# Patient Record
Sex: Female | Born: 1970 | Race: White | Hispanic: No | Smoking: Never smoker
Health system: Southern US, Community
[De-identification: ages and names within clinical notes are randomized; demographics above are authoritative.]

## PROBLEM LIST (undated history)

## (undated) DIAGNOSIS — F32A Depression, unspecified: Secondary | ICD-10-CM

## (undated) DIAGNOSIS — F329 Major depressive disorder, single episode, unspecified: Secondary | ICD-10-CM

## (undated) DIAGNOSIS — E079 Disorder of thyroid, unspecified: Secondary | ICD-10-CM

---

## 1998-09-01 ENCOUNTER — Other Ambulatory Visit: Admission: RE | Admit: 1998-09-01 | Discharge: 1998-09-01 | Payer: Self-pay | Admitting: Family Medicine

## 1999-10-13 ENCOUNTER — Other Ambulatory Visit: Admission: RE | Admit: 1999-10-13 | Discharge: 1999-10-13 | Payer: Self-pay | Admitting: Family Medicine

## 2000-07-11 ENCOUNTER — Encounter: Payer: Self-pay | Admitting: Family Medicine

## 2000-07-11 ENCOUNTER — Encounter: Admission: RE | Admit: 2000-07-11 | Discharge: 2000-07-11 | Payer: Self-pay | Admitting: Family Medicine

## 2002-08-09 ENCOUNTER — Encounter: Admission: RE | Admit: 2002-08-09 | Discharge: 2002-08-09 | Payer: Self-pay | Admitting: Family Medicine

## 2002-08-09 ENCOUNTER — Encounter: Payer: Self-pay | Admitting: Family Medicine

## 2002-10-26 ENCOUNTER — Other Ambulatory Visit: Admission: RE | Admit: 2002-10-26 | Discharge: 2002-10-26 | Payer: Self-pay | Admitting: Family Medicine

## 2002-12-21 ENCOUNTER — Other Ambulatory Visit: Admission: RE | Admit: 2002-12-21 | Discharge: 2002-12-21 | Payer: Self-pay | Admitting: Family Medicine

## 2003-07-18 ENCOUNTER — Encounter (HOSPITAL_COMMUNITY): Admission: RE | Admit: 2003-07-18 | Discharge: 2003-10-16 | Payer: Self-pay | Admitting: Family Medicine

## 2003-12-20 ENCOUNTER — Other Ambulatory Visit: Admission: RE | Admit: 2003-12-20 | Discharge: 2003-12-20 | Payer: Self-pay | Admitting: Family Medicine

## 2004-01-14 ENCOUNTER — Ambulatory Visit: Payer: Self-pay | Admitting: Family Medicine

## 2004-02-04 ENCOUNTER — Ambulatory Visit: Payer: Self-pay | Admitting: Family Medicine

## 2004-02-14 ENCOUNTER — Ambulatory Visit: Payer: Self-pay | Admitting: Family Medicine

## 2004-03-10 ENCOUNTER — Ambulatory Visit: Payer: Self-pay | Admitting: Family Medicine

## 2004-03-17 ENCOUNTER — Ambulatory Visit: Payer: Self-pay | Admitting: Family Medicine

## 2004-04-19 ENCOUNTER — Emergency Department (HOSPITAL_COMMUNITY): Admission: EM | Admit: 2004-04-19 | Discharge: 2004-04-19 | Payer: Self-pay | Admitting: Emergency Medicine

## 2004-04-20 ENCOUNTER — Ambulatory Visit: Payer: Self-pay | Admitting: Family Medicine

## 2004-05-08 ENCOUNTER — Ambulatory Visit: Payer: Self-pay | Admitting: Family Medicine

## 2004-05-13 ENCOUNTER — Encounter (HOSPITAL_COMMUNITY): Admission: RE | Admit: 2004-05-13 | Discharge: 2004-08-11 | Payer: Self-pay | Admitting: Endocrinology

## 2004-06-02 ENCOUNTER — Ambulatory Visit (HOSPITAL_COMMUNITY): Admission: RE | Admit: 2004-06-02 | Discharge: 2004-06-02 | Payer: Self-pay | Admitting: *Deleted

## 2004-06-08 ENCOUNTER — Ambulatory Visit: Payer: Self-pay | Admitting: Family Medicine

## 2004-06-30 ENCOUNTER — Ambulatory Visit: Payer: Self-pay | Admitting: Family Medicine

## 2004-07-06 ENCOUNTER — Encounter: Admission: RE | Admit: 2004-07-06 | Discharge: 2004-07-06 | Payer: Self-pay | Admitting: Family Medicine

## 2004-08-04 ENCOUNTER — Ambulatory Visit: Payer: Self-pay | Admitting: Family Medicine

## 2004-08-25 ENCOUNTER — Ambulatory Visit: Payer: Self-pay | Admitting: Family Medicine

## 2004-09-16 ENCOUNTER — Ambulatory Visit: Payer: Self-pay | Admitting: Family Medicine

## 2004-10-16 ENCOUNTER — Ambulatory Visit: Payer: Self-pay | Admitting: Family Medicine

## 2004-10-30 ENCOUNTER — Ambulatory Visit: Payer: Self-pay | Admitting: Family Medicine

## 2004-12-15 ENCOUNTER — Ambulatory Visit: Payer: Self-pay | Admitting: Family Medicine

## 2004-12-22 ENCOUNTER — Ambulatory Visit: Payer: Self-pay | Admitting: Family Medicine

## 2004-12-22 ENCOUNTER — Other Ambulatory Visit: Admission: RE | Admit: 2004-12-22 | Discharge: 2004-12-22 | Payer: Self-pay | Admitting: Family Medicine

## 2004-12-22 ENCOUNTER — Encounter (INDEPENDENT_AMBULATORY_CARE_PROVIDER_SITE_OTHER): Payer: Self-pay | Admitting: *Deleted

## 2005-01-06 ENCOUNTER — Ambulatory Visit: Payer: Self-pay | Admitting: Family Medicine

## 2005-03-17 ENCOUNTER — Encounter (INDEPENDENT_AMBULATORY_CARE_PROVIDER_SITE_OTHER): Payer: Self-pay | Admitting: *Deleted

## 2005-03-17 ENCOUNTER — Other Ambulatory Visit: Admission: RE | Admit: 2005-03-17 | Discharge: 2005-03-17 | Payer: Self-pay | Admitting: Family Medicine

## 2005-03-17 ENCOUNTER — Ambulatory Visit: Payer: Self-pay | Admitting: Family Medicine

## 2005-07-23 ENCOUNTER — Ambulatory Visit: Payer: Self-pay | Admitting: Family Medicine

## 2005-11-19 ENCOUNTER — Ambulatory Visit: Payer: Self-pay | Admitting: Family Medicine

## 2006-02-22 ENCOUNTER — Ambulatory Visit: Payer: Self-pay | Admitting: Family Medicine

## 2006-02-23 LAB — CONVERTED CEMR LAB: TSH: 3.8 microintl units/mL (ref 0.35–5.50)

## 2006-05-20 ENCOUNTER — Ambulatory Visit: Payer: Self-pay | Admitting: Family Medicine

## 2006-05-20 LAB — CONVERTED CEMR LAB
BUN: 9 mg/dL (ref 6–23)
Basophils Relative: 0.3 % (ref 0.0–1.0)
CO2: 29 meq/L (ref 19–32)
Chloride: 99 meq/L (ref 96–112)
Creatinine, Ser: 0.9 mg/dL (ref 0.4–1.2)
Eosinophils Absolute: 0.1 10*3/uL (ref 0.0–0.6)
Eosinophils Relative: 1 % (ref 0.0–5.0)
GFR calc Af Amer: 91 mL/min
HCT: 40.3 % (ref 36.0–46.0)
Lymphocytes Relative: 36.8 % (ref 12.0–46.0)
Monocytes Absolute: 0.8 10*3/uL — ABNORMAL HIGH (ref 0.2–0.7)
Neutro Abs: 3.5 10*3/uL (ref 1.4–7.7)
RDW: 12.4 % (ref 11.5–14.6)
Sodium: 137 meq/L (ref 135–145)
WBC: 6.9 10*3/uL (ref 4.5–10.5)

## 2006-05-31 ENCOUNTER — Ambulatory Visit: Payer: Self-pay | Admitting: Endocrinology

## 2006-06-07 ENCOUNTER — Encounter: Payer: Self-pay | Admitting: Endocrinology

## 2006-06-07 LAB — CONVERTED CEMR LAB
Catecholamines Tot(E+NE) 24 Hr U: 0.092 mg/24hr
Dopamine 24 Hr Urine: 102 mcg/24hr (ref <500)
Metanephrines, Ur: 59 (ref 19–140)

## 2006-07-01 ENCOUNTER — Ambulatory Visit: Payer: Self-pay | Admitting: Family Medicine

## 2006-07-01 ENCOUNTER — Encounter: Admission: RE | Admit: 2006-07-01 | Discharge: 2006-07-01 | Payer: Self-pay | Admitting: Family Medicine

## 2006-07-01 ENCOUNTER — Inpatient Hospital Stay (HOSPITAL_COMMUNITY): Admission: EM | Admit: 2006-07-01 | Discharge: 2006-07-03 | Payer: Self-pay | Admitting: Emergency Medicine

## 2006-07-02 ENCOUNTER — Ambulatory Visit: Payer: Self-pay | Admitting: Internal Medicine

## 2006-07-04 ENCOUNTER — Ambulatory Visit: Payer: Self-pay | Admitting: Family Medicine

## 2006-07-07 ENCOUNTER — Ambulatory Visit: Payer: Self-pay | Admitting: Family Medicine

## 2006-07-14 ENCOUNTER — Ambulatory Visit: Payer: Self-pay | Admitting: Family Medicine

## 2006-07-26 ENCOUNTER — Ambulatory Visit: Payer: Self-pay | Admitting: Family Medicine

## 2006-07-26 LAB — CONVERTED CEMR LAB: TSH: 2.27 microintl units/mL (ref 0.35–5.50)

## 2006-08-02 ENCOUNTER — Ambulatory Visit: Payer: Self-pay | Admitting: Family Medicine

## 2006-08-09 ENCOUNTER — Ambulatory Visit: Payer: Self-pay | Admitting: Family Medicine

## 2006-09-07 ENCOUNTER — Ambulatory Visit: Payer: Self-pay | Admitting: Family Medicine

## 2006-09-13 ENCOUNTER — Ambulatory Visit: Payer: Self-pay | Admitting: Family Medicine

## 2006-09-27 DIAGNOSIS — J45909 Unspecified asthma, uncomplicated: Secondary | ICD-10-CM | POA: Insufficient documentation

## 2006-09-27 DIAGNOSIS — J309 Allergic rhinitis, unspecified: Secondary | ICD-10-CM | POA: Insufficient documentation

## 2006-11-24 ENCOUNTER — Telehealth: Payer: Self-pay | Admitting: Family Medicine

## 2006-11-25 ENCOUNTER — Encounter: Payer: Self-pay | Admitting: Endocrinology

## 2006-11-25 DIAGNOSIS — E039 Hypothyroidism, unspecified: Secondary | ICD-10-CM | POA: Insufficient documentation

## 2006-11-25 DIAGNOSIS — F329 Major depressive disorder, single episode, unspecified: Secondary | ICD-10-CM

## 2007-06-07 IMAGING — CT CT ANGIO CHEST
3 of 4 series · 19 of 30 positions shown · IV contrast ([ID] OMNI 300)
Comparison: none

CLINICAL DATA: Chest pain and shortness of breath 1 week postop pelvic surgery.  
 CT ANGIOGRAPHY OF CHEST:
TECHNIQUE: Multidetector CT imaging of the chest was performed during bolus injection of intravenous contrast.  Multiplanar CT angiographic image reconstructions were generated to evaluate the vascular anatomy.
 Contrast:  125 cc Omnipaque 300

[Series 4: pe study · axial · 0.70mm/px · z∈[-251,-53]mm · 6 of 119 slices shown]
[im 20/119  lung]
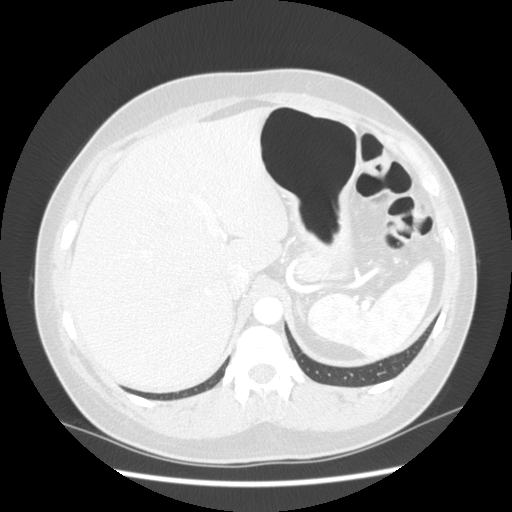
[im 40/119  mediastinal]
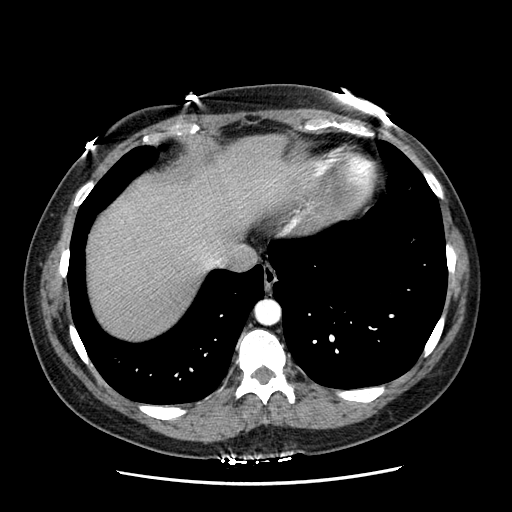
[im 60/119  lung]
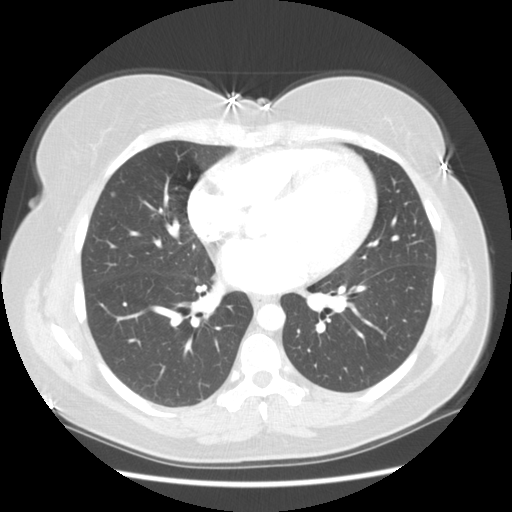
[im 63/119  mediastinal]
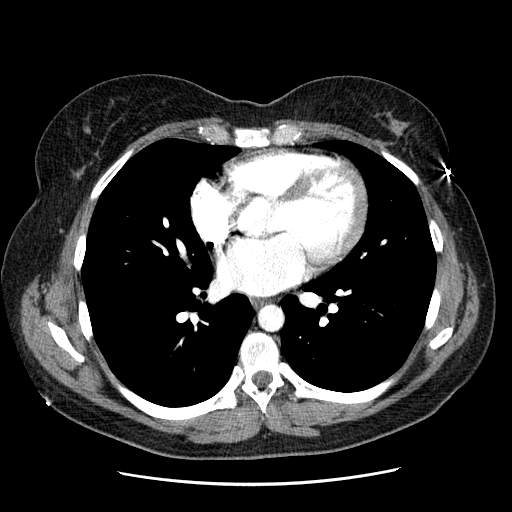
[im 79/119  lung]
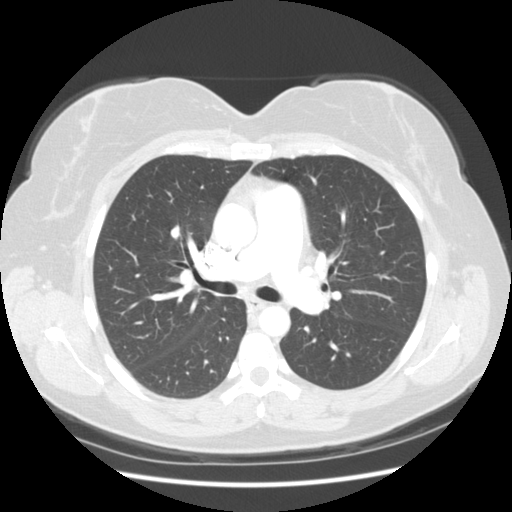
[im 99/119  mediastinal]
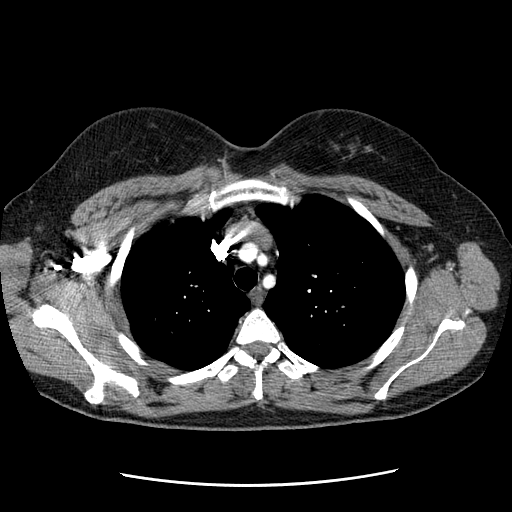

[Series 600: coronal · coronal · 0.70mm/px · 5 of 124 slices shown]
[im 18/124  lung]
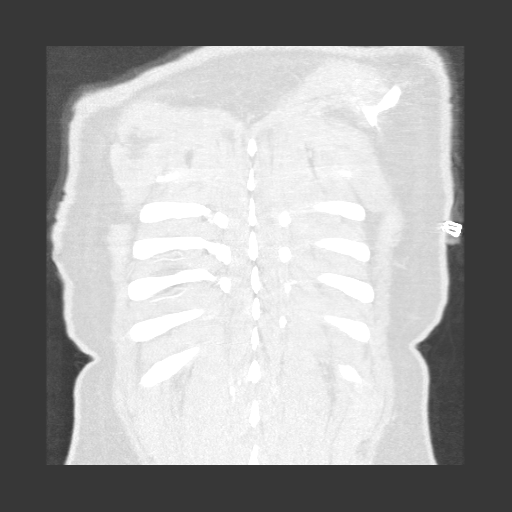
[im 36/124  lung]
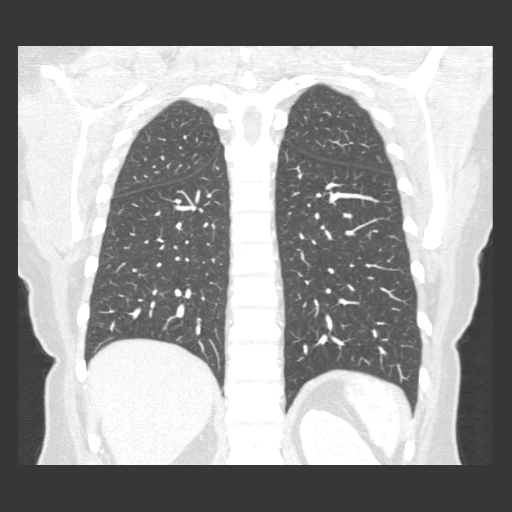
[im 53/124  lung]
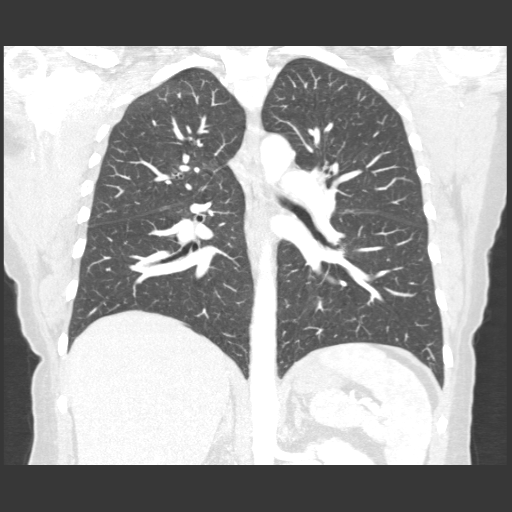
[im 71/124  lung]
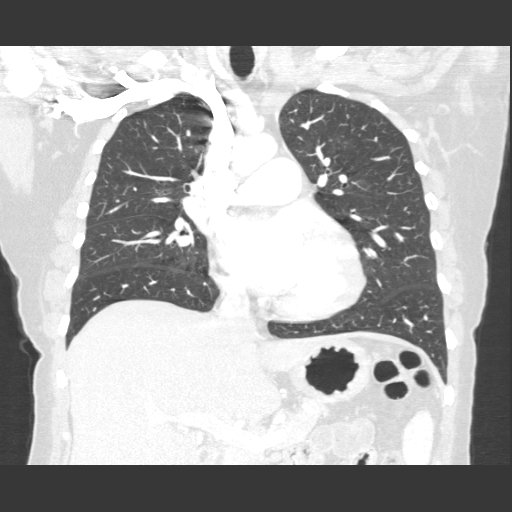
[im 88/124  lung]
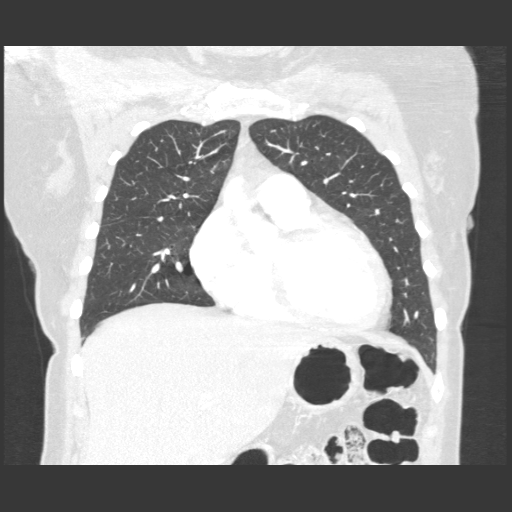

[Series 601: sagittal · sagittal · 0.70mm/px · 8 of 190 slices shown]
[im 18/190  lung]
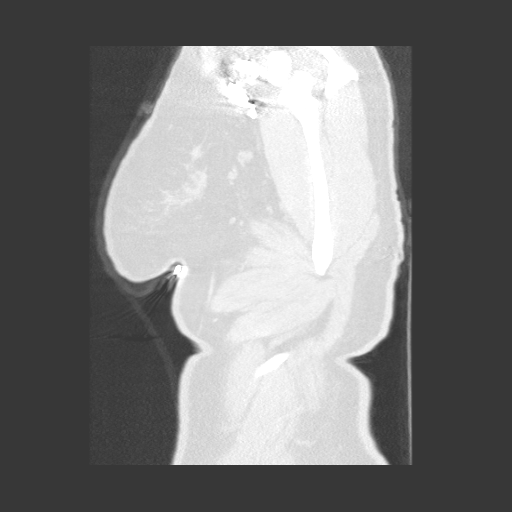
[im 52/190  lung]
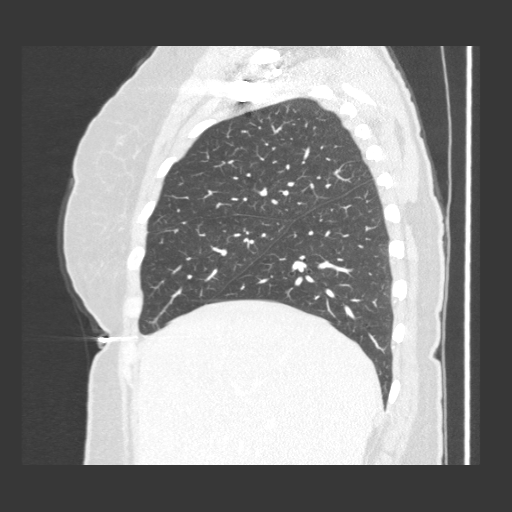
[im 69/190  lung]
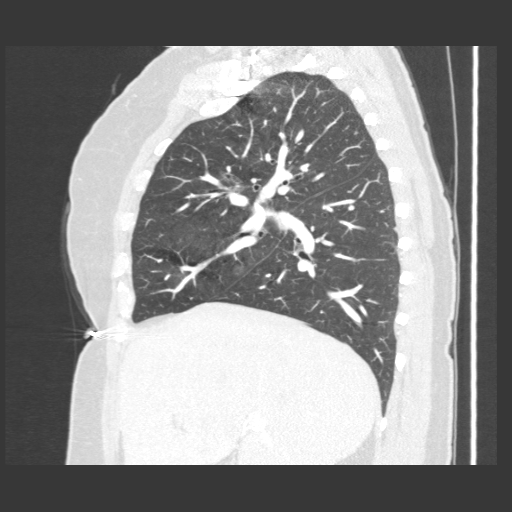
[im 86/190  lung]
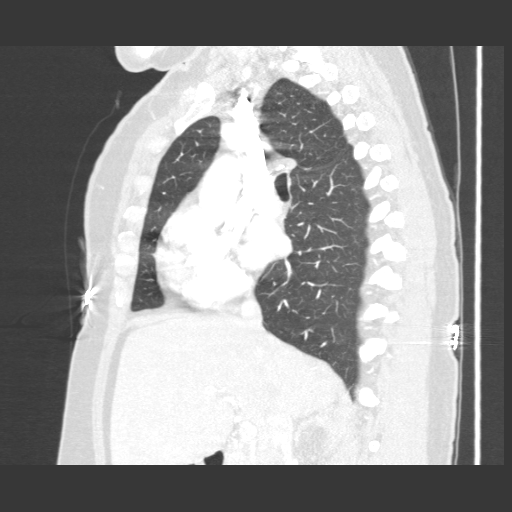
[im 104/190  lung]
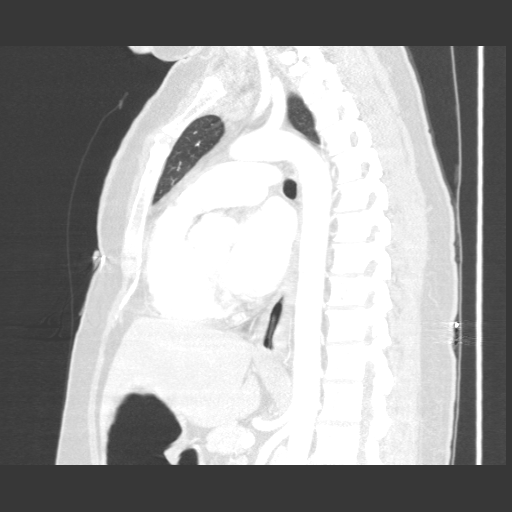
[im 121/190  lung]
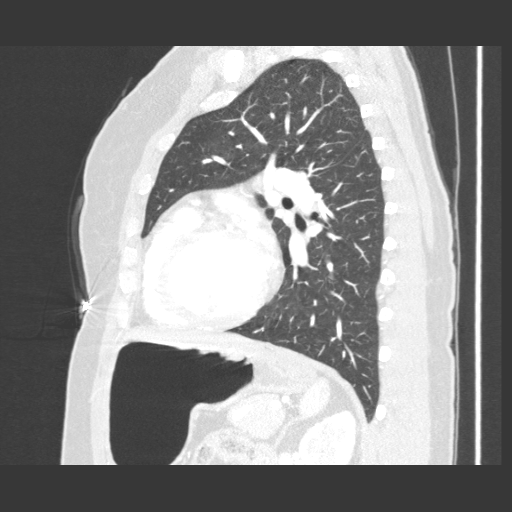
[im 138/190  lung]
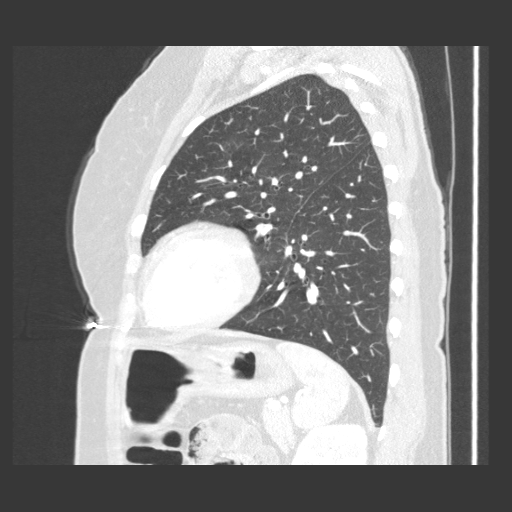
[im 172/190  lung]
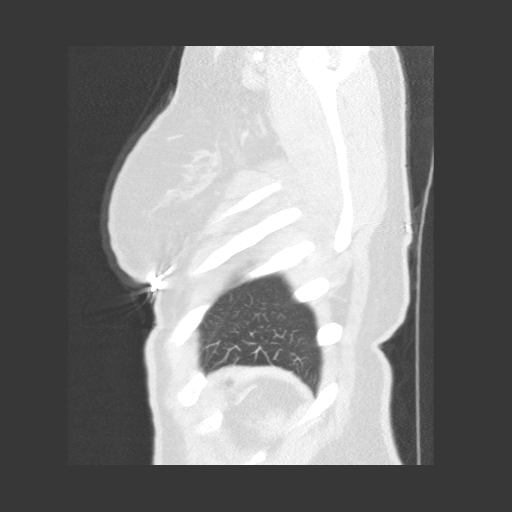

[19 of 30 positions shown; findings below may reference images not displayed]

FINDINGS: The patient has multiple small right-sided pulmonary emboli.  Lungs are clear and the heart size is normal.  The patient has a sclerotic lesion in the left side of T-7 and a smaller lesion in the left side of T-9 which probably represent benign hemangiomas.  They are not felt to be significant.  The visualized portion of the upper abdomen is normal.
IMPRESSION: Multiple small right-sided pulmonary emboli.  This report was called to Dr. Josegabriel by Dr. Edemilsom at [DATE] p.m.

## 2007-06-07 IMAGING — CR DG CHEST 2V
2 series · 2 of 2 positions shown · non-contrast
Comparison: None

CLINICAL DATA: Shortness of breath

CHEST - 2 VIEW:

[w chest pa]
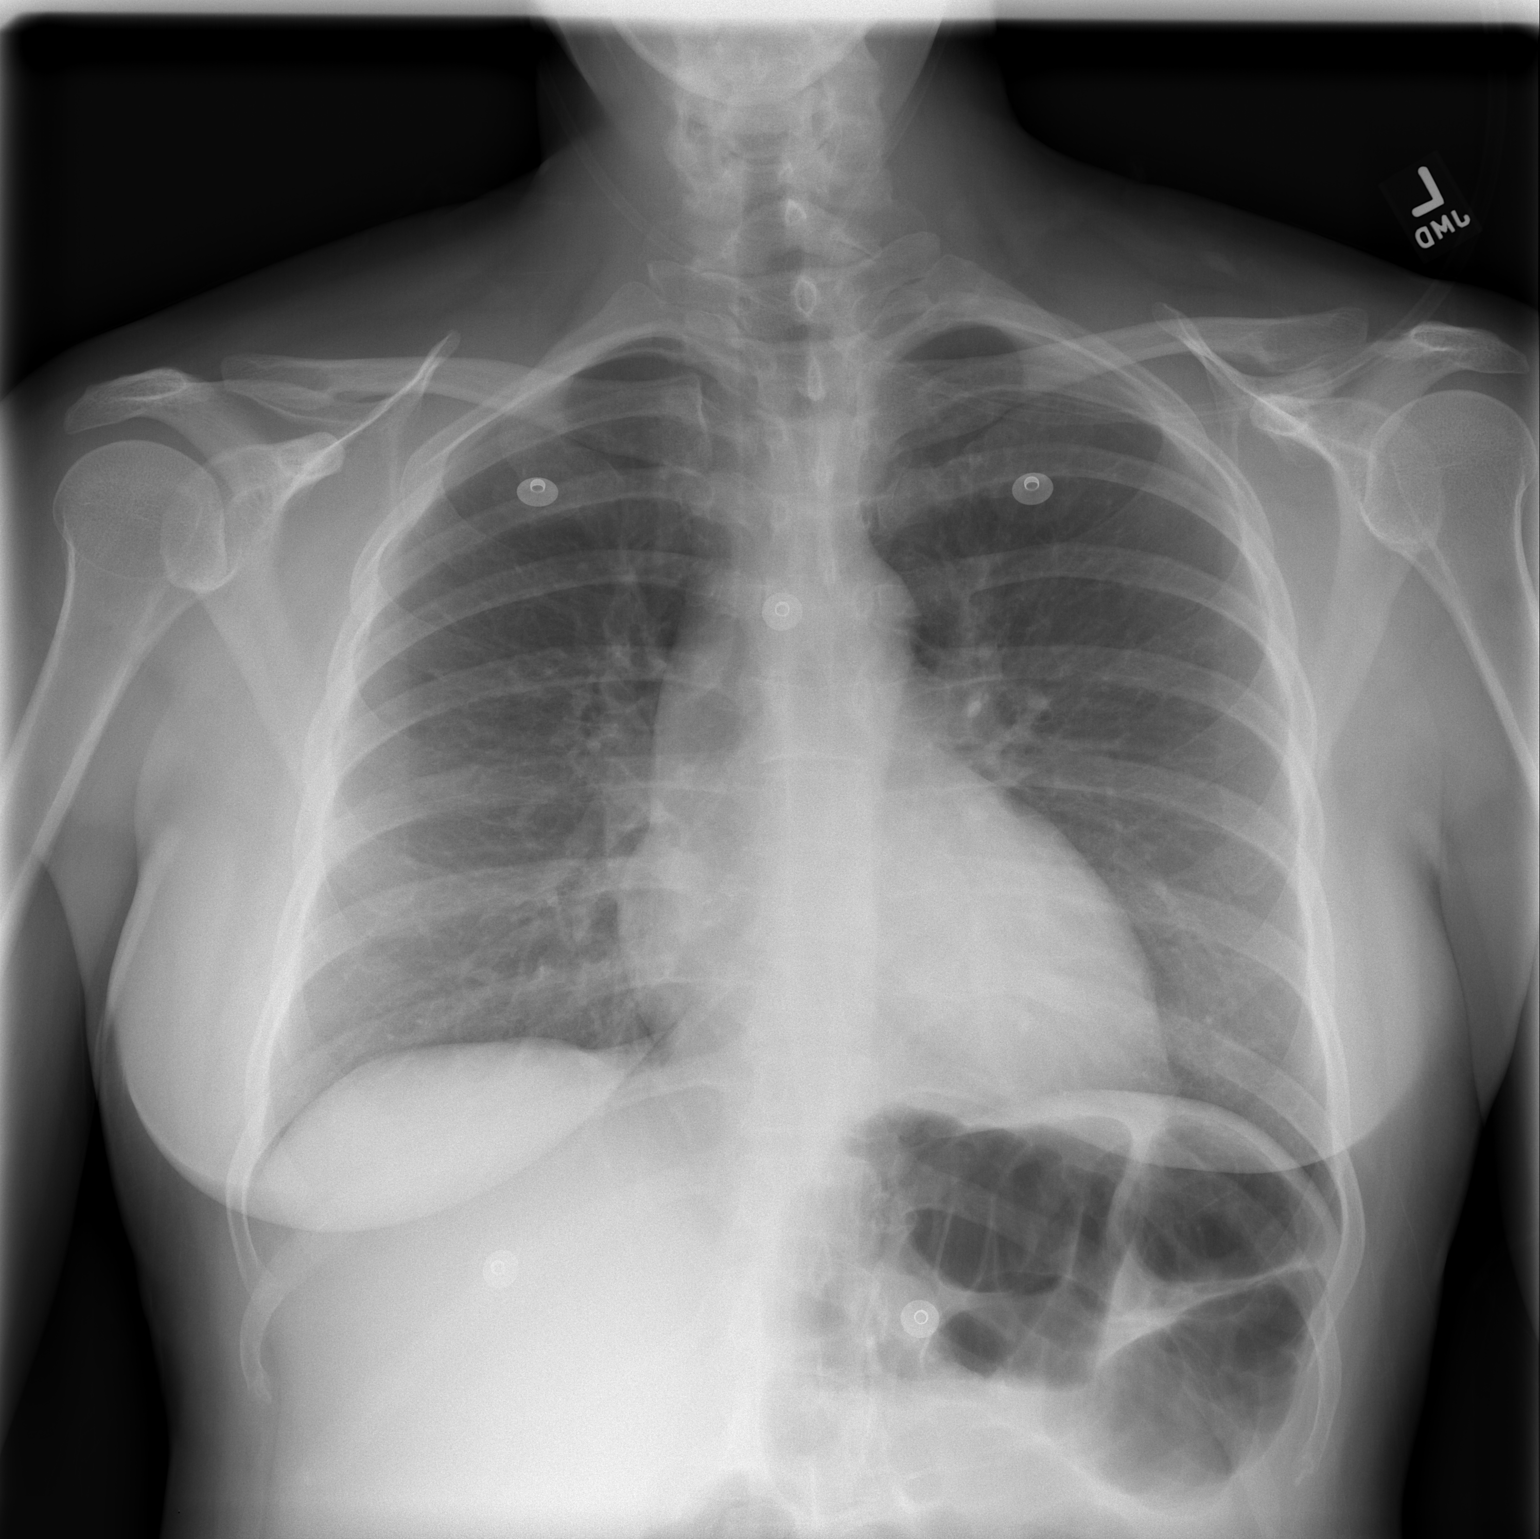

[w chest lat]
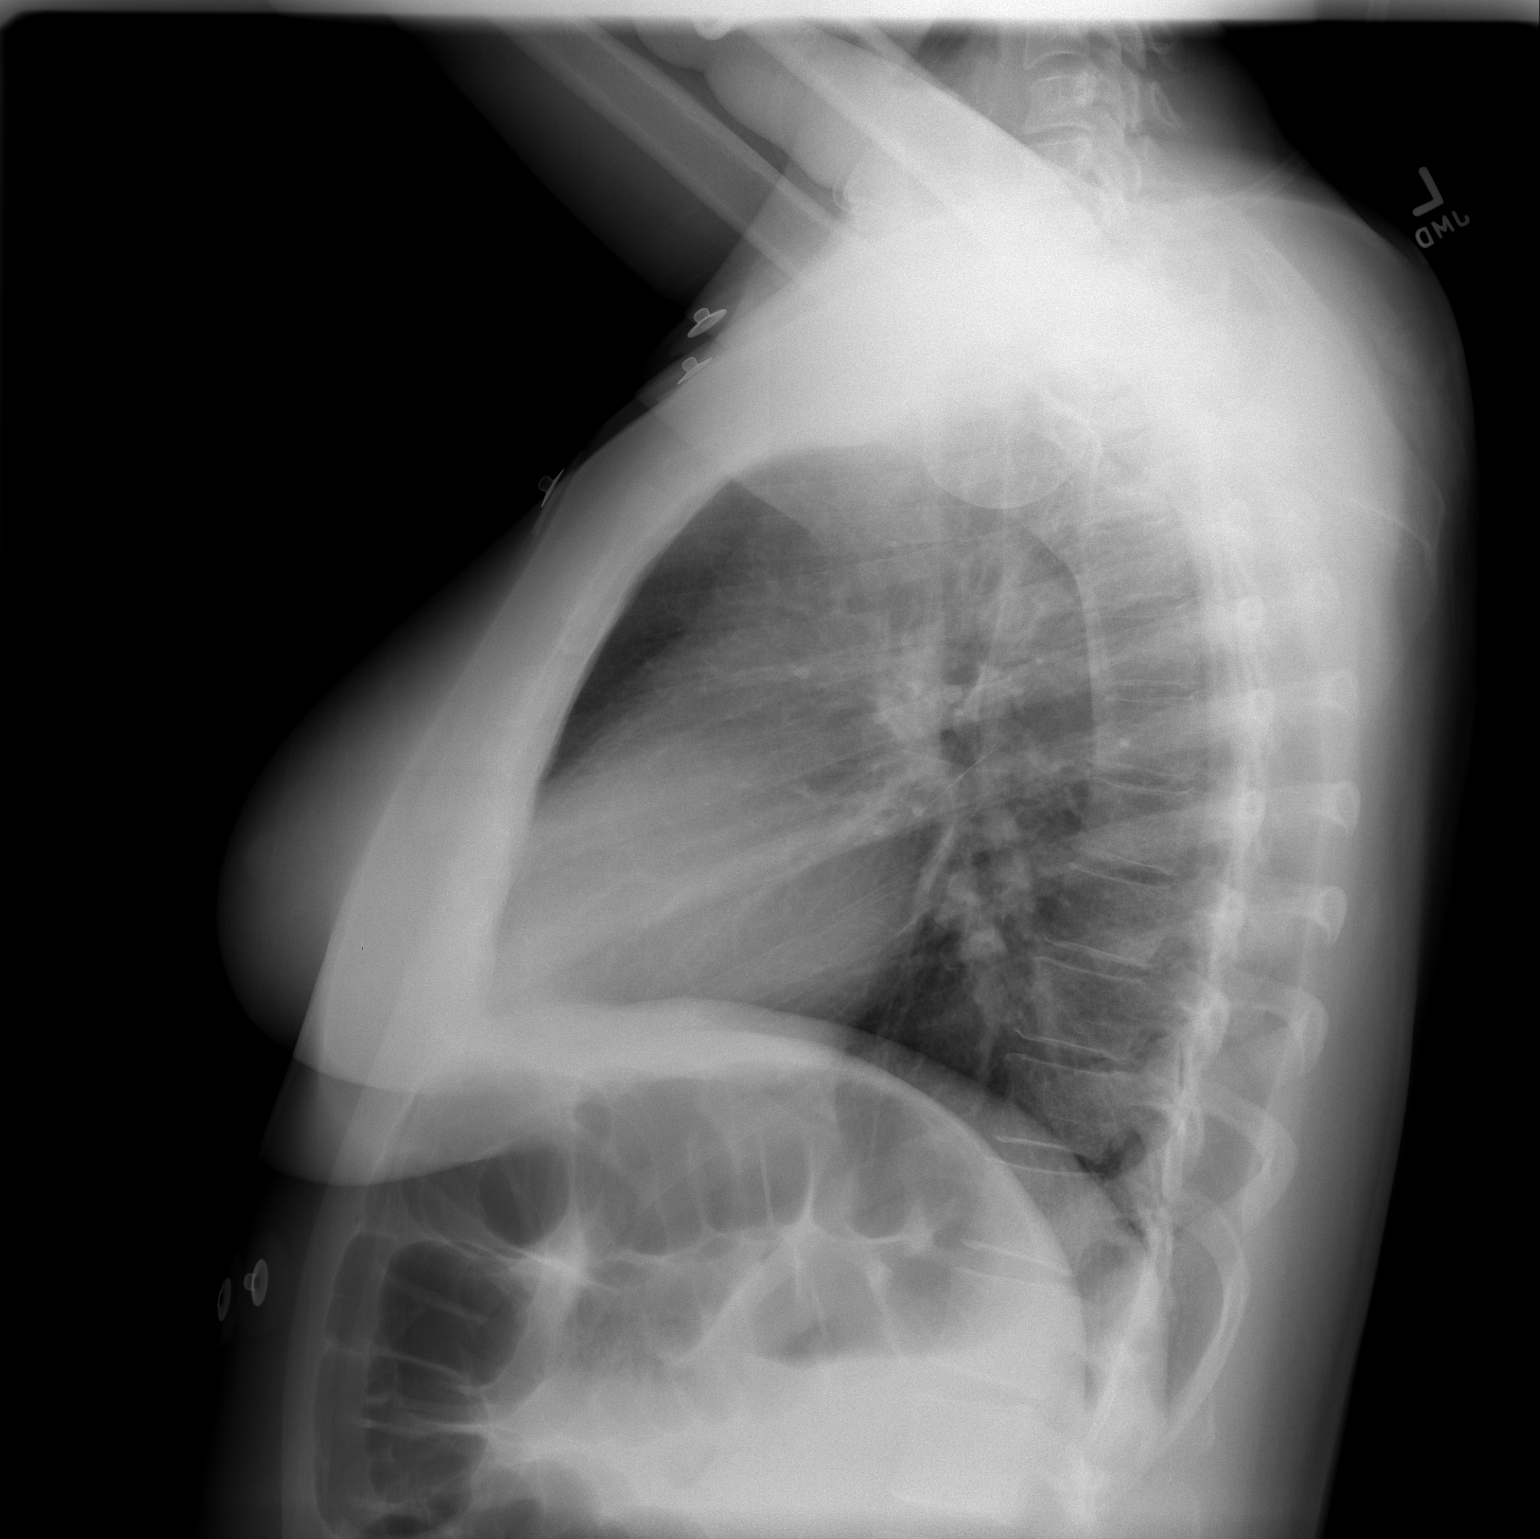

[2 of 2 positions shown; findings below may reference images not displayed]

FINDINGS: The heart size and mediastinal contours are within normal limits. 
Both lungs are clear.  The visualized skeletal structures are unremarkable.
IMPRESSION: No active cardiopulmonary disease

## 2008-04-05 ENCOUNTER — Telehealth: Payer: Self-pay | Admitting: *Deleted

## 2010-02-19 ENCOUNTER — Emergency Department (HOSPITAL_COMMUNITY): Admission: EM | Admit: 2010-02-19 | Discharge: 2009-07-24 | Payer: Self-pay | Admitting: Emergency Medicine

## 2010-07-31 NOTE — Assessment & Plan Note (Signed)
[facility]                                   ON-CALL NOTE   LOVETTE, MERTA                       MRN:          [id number]  DATE:11/20/2005                            DOB:          Mar 20, 1970    Telephone triage note on September 8, time received is 12:26 p.m.  The  patient is Sharon Richard, the caller is the same.  She sees Dr. Tawanna Cooler.  Date  of birth [birth year], telephone [phone].  The patient saw Dr. Tawanna Cooler  yesterday with complaints of generalized fatigue.  She knows that he drew a  test to check her thyroid level, but she is not sure if he checked anything  else.  She had a history of bleeding ulcers earlier in the year, and was  actually hospitalized briefly with a hemoglobin of 5.  She has had no other  symptoms, no obvious blood in the stools, etcetera.  She wonders if she  could be anemic.  While we were here at the Saturday clinic, I did try to  retrieve her laboratory results in the computer, nothing was available.  I  told her to rest and stay out of the heat over the weekend.  She should  contact Dr. Tawanna Cooler on Monday for her results, and if she has not had a  hemoglobin checked recently, I agreed that this would be a good idea.                                   Tera Mater. Clent Ridges, MD   SAF/MedQ  DD:  11/20/2005  DT:  11/21/2005  Job #:  [id number]

## 2010-07-31 NOTE — Op Note (Signed)
Sharon Richard, Sharon Richard NO.:  [id number]   MEDICAL RECORD NO.:  [id number]          PATIENT TYPE:  AMB   LOCATION:  ENDO                         FACILITY:  [facility]   PHYSICIAN:  Georgiana Spinner, M.D.    DATE OF BIRTH:  [birth year]   DATE OF PROCEDURE:  06/02/2004  DATE OF DISCHARGE:                                 OPERATIVE REPORT   PROCEDURE:  Colonoscopy.   INDICATIONS:  Rectal bleeding.   ANESTHESIA:  None further given.   PROCEDURE:  With the patient mildly sedated in the left lateral decubitus  position, the Olympus videoscopic colonoscope was inserted into the rectum,  passed under direct vision to the cecum, identified by ileocecal valve and  the base of cecum; however, the prep in this area was certainly suboptimal  and there was liquid brown material with solid material in it that clogged  our scope.  We tried to suction it.  We washed and suctioned as best we  could but withdrew, taking circumferential views of the colonic mucosa,  stopping only in the rectum which appeared normal, and the rectum showed  hemorrhoids on retroflexed view.  The endoscope was straightened and  withdrawn.  The patient's vital signs and pulse oximetry remained stable.  Patient tolerated the procedure well with no apparent complications.   FINDINGS:  Suboptimal colonoscopic examination due to poor prep mostly in  the right side of the colon, but in the area scattered throughout.   PLAN:  Will have patient followup with me as an outpatient to evaluate  further any iron deficiency.      GMO/MEDQ  D:  06/02/2004  T:  06/02/2004  Job:  [id number]   cc:   Tinnie Gens A. Tawanna Cooler, M.D. [facility]

## 2010-07-31 NOTE — Op Note (Signed)
NAMELEONORA, GORES NO.:  [id number]   MEDICAL RECORD NO.:  [id number]          PATIENT TYPE:  AMB   LOCATION:  ENDO                         FACILITY:  [facility]   PHYSICIAN:  Georgiana Spinner, M.D.    DATE OF BIRTH:  [birth year]   DATE OF PROCEDURE:  DATE OF DISCHARGE:                                 OPERATIVE REPORT   PROCEDURE:  Upper endoscopy.   INDICATIONS:  Hemoccult positivity.   ANESTHESIA:  Demerol 100 mg, Versed 10 mg.   DESCRIPTION OF PROCEDURE:  With the patient mildly sedated in the left  lateral decubitus position, the Olympus videoscopic endoscope was inserted  into the mouth and passed under direct vision through the esophagus which  appeared normal and into the stomach where the fundus, body, antrum,  duodenal bulb, and second portion of the duodenum were visualized and all  appeared normal.  From this point, the endoscope was slowly withdrawn,  taking circumferential views of the duodenal mucosa until the endoscope was  then pulled back into the stomach and placed in retroflexion to view the  stomach from below.  The endoscope was then straightened and withdrawn,  taking circumferential views of the remaining gastric and esophageal mucosa.  The patient's vital signs and pulse oximetry remained stable and the patient  tolerated the procedure well with no apparent complications.   FINDINGS:  Unremarkable examination.   PLAN:  Proceed to colonoscopy.      GMO/MEDQ  D:  06/02/2004  T:  06/02/2004  Job:  [id number]   cc:   Tinnie Gens A. Tawanna Cooler, M.D. [facility]

## 2010-07-31 NOTE — Assessment & Plan Note (Signed)
[facility]                                   ON-CALL NOTE   Sharon Richard, Sharon Richard                         MRN:          [id number]  DATE:01/15/2006                            DOB:          1971-01-30    I received a call from the pharmacist at [facility]. His name was Reita Cliche, the  phone number is [phone]. He states that a Aurther Loft called in two  prescriptions for [name], including hydrochlorothiazide and hydrocodone.  The pharmacist was concerned about the prescriptions being called in during  the weekend. I advised the pharmacist that I am the doctor on call, and I  did not call in any prescriptions for [name]. Additionally, the  pharmacist relayed that the person that called in the prescription did not  leave a last name.   PLAN:  Best judgment. Maple Mirza, the pharmacist, that if there was  minimal information from the caller, I would hold off on the prescription. I  did advise that I would dictate a note so Dr. Tawanna Cooler would be aware of this  issue that was brought up over the weekend. These may have been legitimate  prescriptions, but given the limited information, I do concur with the  concern the pharmacist brought to my attention.    ______________________________  Leanne Chang, M.D.    LA/MedQ  DD: 01/15/2006  DT: 01/16/2006  Job #: [id number]   cc:   Tinnie Gens A. Tawanna Cooler, MD

## 2010-07-31 NOTE — H&P (Signed)
Sharon Richard, Sharon Richard                ACCOUNT NO.:  [id number]   MEDICAL RECORD NO.:  [id number]          PATIENT TYPE:  INP   LOCATION:  1402                         FACILITY:  [facility]   PHYSICIAN:  Eugenio Hoes. Tawanna Cooler, MD    DATE OF BIRTH:  1970/07/01   DATE OF ADMISSION:  07/01/2006  DATE OF DISCHARGE:                              HISTORY & PHYSICAL   PRIMARY CARE PHYSICIAN:  Dr. Kelle Darting.   HISTORY OF PRESENT ILLNESS:  This is one of many [facility] admissions for this 40 year old single white female who is  admitted to the hospital for evaluation of multiple pulmonary emboli.   The patient underwent a 4 hour gynecologic procedure a week ago at [facility] for endometriosis.  Postoperatively she did well except it was noticed she had a lot of  facial swelling in the PACU.  Her mother said her pulse oximetry dropped  to the mid 80's, she got short of breath.  They did a chest x-ray  because they thought she had fluid retention and they felt like the  anesthesia may have flared her asthma.  She does have history of chronic  asthma and allergic rhinitis.  They went ahead and gave her Lasix,  diuresed her, she felt well.  Had a post-op wound check this past  Tuesday and all of that was well.   She comes in to the office on July 01, 2006 having been told she needs  followup for her asthma.   She was seen in the office for evaluation.  At that time her lungs were  clear to auscultation. However she had a resting tachycardia of 135  beats per minute.  She was otherwise well.  She had no cough, no chest  pain, no swelling in her legs, etc.  Wound sites were inspected, they  looked normal. Because of the recent history of pelvic surgery and the  sinus tachycardia, a CT scan was done to rule out pulmonary emboli. The  radiologist called around 7 o'clock and identified two pulmonary emboli.  She was therefore admitted to the hospital for  further evaluation and  treatment.   PAST MEDICAL HISTORY:  1. She had a GI bleed and was admitted to [facility].  2. She has history of hyperthyroidism, was treated with RAI x2. She is      currently followed by Dr. Everardo All.   PAST ILLNESSES:  Other than above negative.   INJURIES:  Negative.   ALLERGIES:  PENICILLIN (causes shortness of breath and throat swelling).   SOCIAL HISTORY:  She does not use any tobacco products nor alcohol.   CURRENT MEDICATIONS:  1. Lexapro 40 mg daily.  2. Diazide 37.5/25 one daily.  3. Levothyroxine 100 mcg daily.  4. Wellbutrin XL 300 daily.   REVIEW OF SYSTEMS:  HEENT:  Pertinent for a history of migraine  headaches. She is currently asymptomatic.  CARDIOPULMONARY:  Negative  except for a history of allergic rhinitis and asthma.  GI:  Pertinent  for a GI bleed in January of  2006 and also July of 2006. She is  currently asymptomatic. GU: Negative.  GYN:  She has had the history of  endometriosis and recently had surgery for removal of endometriomas as  noted above. She has chronic pelvic pain because of this problem.  MUSCULOSKELETAL:  Negative.  SKIN:  Negative.  ENDOCRINE:  Pertinent for  a history of hyperthyroidism and treated with RAI x 2, currently on  levothyroxine via Dr. Everardo All.   FAMILY HISTORY:  Mother has had a history of colon polyps, otherwise  well. Father in good health, no problems. She has no siblings.   SOCIAL HISTORY:  She is single, lives here in [place]. She is a self-  employed Advertising account planner.   PHYSICAL EXAMINATION:  VITAL SIGNS:  Weight 164, temperature was 98,  pulse was 135 and regular, respiration 12, blood pressure 130/92.  GENERAL:  She is a well-developed, well-nourished white female in no  acute distress.  HEENT:  Head, eyes, nose and throat were negative.  NECK:  Supple, thyroid is not enlarged and is nonpalpable but she has  had previous radiation surgery.  CARDIAC EXAM:  Except for  extremely rapid heart rate, was negative.  ABDOMEN:  Shows three incisions where she had the laparoscopy. They are  healing well and are nontender, they are not red.  PELVIC/RECTAL:  Deferred.  EXTREMITIES:  Normal skin. Normal peripheral pulses. No swelling of her  legs or calf tenderness, etc.   CT scan of her lungs with contrast shows two pulmonary emboli.   IMPRESSION:  1. Pulmonary emboli.  2. History of allergic rhinitis.  3. History of asthma.  4. History of hyperthyroidism treated with RAI x2, currently on      replacement therapy.  5. History of migraine headaches.  6. History of GI bleed x2.   The patient will be admitted to the hospital. She will be started on  Lovenox and Coumadin. Will start on a proton pump inhibitor because of  her history of GI bleed's.  Will refrain from any aspirin products. Will  have to monitor very closely.      Jeffrey A. Tawanna Cooler, MD  Electronically Signed     JAT/MEDQ  D:  07/02/2006  T:  07/02/2006  Job:  [id number]   cc:   Department of OB/GYN  [facility]

## 2010-07-31 NOTE — Consult Note (Signed)
[facility]                          ENDOCRINOLOGY CONSULTATION   HALLELUJAH, WYSONG                       MRN:          [id number]  DATE:05/31/2006                            DOB:          Jul 23, 1970    REFERRING PHYSICIAN:  Tinnie Gens A. Tawanna Cooler, MD   REASON FOR REFERRAL:  Check thyroid.   HISTORY OF PRESENT ILLNESS:  A 40 year old woman who was treated with  iodine-131 therapy in 2005, for hyperthyroidism. Since then, she has  been on Synthroid. She states I am not a normal candidate for synthetic  medication. She has 3 months of moderate depression and anxiety with  associated 40 pound weight gain over the past year and some fatigue but  she does not have any associated numbness of the feet. She also has some  symptoms of flushing, headache and excessive diaphoresis. She states  that Lexapro plus Prozac have not helped her symptoms.   PAST MEDICAL HISTORY:  1. Endometriosis.  2. Depression.  3. Peptic ulcer disease.  4. Minimal idiopathic edema.   SOCIAL HISTORY:  She is engaged to be married, she is a Brewing technologist.   FAMILY HISTORY:  Grandmother has hypothyroidism.   REVIEW OF SYSTEMS:  Denies the following:  Fever, syncope, shortness of  breath, nausea, vomiting, diarrhea, skin rash and insomnia.   PHYSICAL EXAMINATION:  VITAL SIGNS:  Blood pressure 142/95, heart rate  is 137, temperature is 98.0, the weight is 164.  GENERAL:  Slightly anxious, no distress.  SKIN:  No rash, not diaphoretic.  HEENT:  No proptosis, no periorbital swelling.  NECK:  Supple. Goiter, thyroid is not palpable.  CHEST:  Clear to auscultation, no respiratory distress.  CARDIOVASCULAR:  No JVD, no edema. On auscultation, her heart rate is  normal and she has a regular rhythm. Pedal pulses are intact.  NEUROLOGIC:  Alert, oriented. There is a slight postural tremor present.   LABORATORY DATA:  Forwarded by Dr. Tawanna Cooler:  On May 20, 2006, TSH normal  at 1.23.  Free T4 and free T3 are also normal.   IMPRESSION:  1. 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  2. Hypertension and intermittent tachycardia.  3. Symptoms of anxiety and excessive diaphoresis.   PLAN:  We discussed the differential diagnosis of her symptoms. I have  advised her not to increase the Synthroid nor to add any other thyroid  hormone supplement out of fear of adverse health effects with her  symptoms and intermittently elevated heart rate.  1. The patient asks me to please change her antidepressant medications      so I have changed the Lexapro/Prozac to Wellbutrin XL 300 mg a day.  2. 24-hour urine for catecholamines and metanephrines.  3. Followup with Dr. Tawanna Cooler within 30 days.  4. Return here p.r.n.     Sean A. Everardo All, MD  Electronically Signed    SAE/MedQ  DD: 06/02/2006  DT: 06/02/2006  Job #: [id number]   cc:   Tinnie Gens A. Tawanna Cooler, MD

## 2010-07-31 NOTE — Discharge Summary (Signed)
[name], [name]                ACCOUNT NO.:  [id number]   MEDICAL RECORD NO.:  [id number]          PATIENT TYPE:  INP   LOCATION:  1402                         FACILITY:  [facility]   PHYSICIAN:  Rosalyn Gess. Norins, MD  DATE OF BIRTH:  1970-05-29   DATE OF ADMISSION:  07/01/2006  DATE OF DISCHARGE:  07/03/2006                               DISCHARGE SUMMARY   ADMISSION DIAGNOSIS:  Pulmonary emboli right lung.   DISCHARGE DIAGNOSES:  1. Pulmonary emboli right lung on Coumadin.  2. 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   HISTORY OF PRESENT ILLNESS:  Sharon Richard is a 40 year old woman who  recently underwent gynecologic surgery for endometriosis at [facility].  She had been having problems with tachycardia and mild  shortness of breath and presented to see Dr. Alonza Smoker in the office on  Friday the 18th.  Because of her shortness of breath and recent surgery,  she was sent for CT angio of the chest which was read out as showing of  multiple pulmonary emboli of the right lung.  The patient was admitted  to be started on anticoagulation therapy, to be covered by Lovenox while  Coumadin dosing became therapeutic.   Please see Dr. Nelida Meuse dictation for past medical history, family  history, social history.   MEDICATIONS ON ADMISSION:  1. Levothyroxine100 mcg daily.  2. Wellbutrin 300 mg daily.  3. Dyazide 37.5/25 daily.  4. Lexapro 40 mg q.h.s.  5. Vicodin p.r.n.  6. Proventil p.r.n..   HOSPITAL COURSE:  #1 - PE.  The patient did well.  She was not  specifically short of breath.  She was started on Coumadin, given a  loaded dose of 10 mg daily on the 18th and 19th.  On the morning of the  20th, her INR was 2.2 and she was thought to be stable and ready for  discharge home to continue on Coumadin therapy for 3-6 months.   #2 - HYPOTHYROID DISEASE.  The patient is status post thyroid at  ablation therapy and has been on oral replacement with some adjustments  being directed  by Dr. Romero Belling at [facility].  The patient, at the time of admission, was on 100 mcg daily.  A  laboratory revealed the patient to have a TSH of 0.02, free T4 2.29,  free T3 of 4.5, all indicating hyperthyroid state.  The patient has been  tachycardia and hypertensive with systolics in the 140 range, diastolics  were over 100.  The plan was to have the patient hold her thyroid dose  for 2 days and then resume at 75 mcg daily.  She is to take Atenolol 50  mg daily until her tachycardia was resolved and her new thyroid dose was  adjusted.   With the patient being therapeutic on her Coumadin.  She is now stable  and ready for discharge home.   DISCHARGE PHYSICAL EXAMINATION:  VITAL SIGNS:  Temperature of 98.2,  blood pressure 142/104, heart rate 127, O2 saturations on 2 liters was  100% GENERAL APPEARANCE:  Well-nourished, well-developed woman in no  acute  distress.  CHEST:  Clear.  HEART:  Heart rate was tachycardia.  No further exam conducted..   DISPOSITION:  The patient is discharged home.  She is to follow up with  Dr. Nelida Meuse office on Monday, July 04, 2006 for pro time.  She will  continue on all of her home medications.  She will be reducing her  levothyroxine dose of 75 mcg daily.  She will take Coumadin 5 mg daily.  The patient will take atenolol 50 mg once a day until her thyroid  function is better adjusted.   The patient's condition at time of discharge is stable and improved.      Rosalyn Gess Norins, MD  Electronically Signed     MEN/MEDQ  D:  07/03/2006  T:  07/04/2006  Job:  [id number]   cc:   Tinnie Gens A. Tawanna Cooler, MD  [address] [place]  [place] [address]   Gregary Signs A. Everardo All, MD  [address]  [place]  [place] [address]

## 2011-12-15 ENCOUNTER — Other Ambulatory Visit (HOSPITAL_BASED_OUTPATIENT_CLINIC_OR_DEPARTMENT_OTHER): Payer: Self-pay | Admitting: Obstetrics and Gynecology

## 2011-12-15 DIAGNOSIS — N809 Endometriosis, unspecified: Secondary | ICD-10-CM

## 2011-12-29 ENCOUNTER — Ambulatory Visit (HOSPITAL_BASED_OUTPATIENT_CLINIC_OR_DEPARTMENT_OTHER)
Admission: RE | Admit: 2011-12-29 | Discharge: 2011-12-29 | Disposition: A | Discharge status: Home / Self Care | Payer: Medicaid Other | Source: Ambulatory Visit | Attending: Obstetrics and Gynecology | Admitting: Obstetrics and Gynecology

## 2011-12-29 DIAGNOSIS — N809 Endometriosis, unspecified: Secondary | ICD-10-CM | POA: Insufficient documentation

## 2011-12-29 DIAGNOSIS — N83209 Unspecified ovarian cyst, unspecified side: Secondary | ICD-10-CM | POA: Insufficient documentation

## 2011-12-29 DIAGNOSIS — I10 Essential (primary) hypertension: Secondary | ICD-10-CM | POA: Insufficient documentation

## 2011-12-29 MED ORDER — IOHEXOL 300 MG/ML  SOLN
100.0000 mL | Freq: Once | INTRAMUSCULAR | Status: AC | PRN
  Administered 2011-12-29: 100 mL via INTRAVENOUS

## 2013-04-03 ENCOUNTER — Emergency Department (HOSPITAL_COMMUNITY)
Admission: EM | Admit: 2013-04-03 | Discharge: 2013-04-03 | Disposition: A | Discharge status: Home / Self Care | Payer: Self-pay | Attending: Emergency Medicine | Admitting: Emergency Medicine

## 2013-04-03 ENCOUNTER — Encounter (HOSPITAL_COMMUNITY): Payer: Self-pay | Admitting: Emergency Medicine

## 2013-04-03 ENCOUNTER — Emergency Department (HOSPITAL_COMMUNITY): Payer: Self-pay

## 2013-04-03 DIAGNOSIS — R109 Unspecified abdominal pain: Secondary | ICD-10-CM

## 2013-04-03 DIAGNOSIS — Z79899 Other long term (current) drug therapy: Secondary | ICD-10-CM | POA: Insufficient documentation

## 2013-04-03 DIAGNOSIS — E079 Disorder of thyroid, unspecified: Secondary | ICD-10-CM | POA: Insufficient documentation

## 2013-04-03 DIAGNOSIS — N390 Urinary tract infection, site not specified: Secondary | ICD-10-CM | POA: Insufficient documentation

## 2013-04-03 DIAGNOSIS — F329 Major depressive disorder, single episode, unspecified: Secondary | ICD-10-CM | POA: Insufficient documentation

## 2013-04-03 DIAGNOSIS — F3289 Other specified depressive episodes: Secondary | ICD-10-CM | POA: Insufficient documentation

## 2013-04-03 DIAGNOSIS — Z880 Allergy status to penicillin: Secondary | ICD-10-CM | POA: Insufficient documentation

## 2013-04-03 DIAGNOSIS — Z3202 Encounter for pregnancy test, result negative: Secondary | ICD-10-CM | POA: Insufficient documentation

## 2013-04-03 DIAGNOSIS — Z91040 Latex allergy status: Secondary | ICD-10-CM | POA: Insufficient documentation

## 2013-04-03 HISTORY — DX: Disorder of thyroid, unspecified: E07.9

## 2013-04-03 HISTORY — DX: Depression, unspecified: F32.A

## 2013-04-03 HISTORY — DX: Major depressive disorder, single episode, unspecified: F32.9

## 2013-04-03 LAB — CBC WITH DIFFERENTIAL/PLATELET
Basophils Absolute: 0 10*3/uL (ref 0.0–0.1)
Basophils Relative: 0 % (ref 0–1)
EOS ABS: 0 10*3/uL (ref 0.0–0.7)
EOS PCT: 0 % (ref 0–5)
HEMATOCRIT: 38.8 % (ref 36.0–46.0)
HEMOGLOBIN: 13.6 g/dL (ref 12.0–15.0)
LYMPHS ABS: 2.5 10*3/uL (ref 0.7–4.0)
LYMPHS PCT: 21 % (ref 12–46)
MCH: 29.4 pg (ref 26.0–34.0)
MCHC: 35.1 g/dL (ref 30.0–36.0)
MCV: 84 fL (ref 78.0–100.0)
Monocytes Absolute: 0.9 10*3/uL (ref 0.1–1.0)
Monocytes Relative: 8 % (ref 3–12)
NEUTROS PCT: 71 % (ref 43–77)
Neutro Abs: 8.4 10*3/uL — ABNORMAL HIGH (ref 1.7–7.7)
PLATELETS: 373 10*3/uL (ref 150–400)
RBC: 4.62 MIL/uL (ref 3.87–5.11)
RDW: 13.5 % (ref 11.5–15.5)
WBC: 11.9 10*3/uL — ABNORMAL HIGH (ref 4.0–10.5)

## 2013-04-03 LAB — COMPREHENSIVE METABOLIC PANEL
ALT: 20 U/L (ref 0–35)
AST: 15 U/L (ref 0–37)
Albumin: 3.8 g/dL (ref 3.5–5.2)
Alkaline Phosphatase: 72 U/L (ref 39–117)
BUN: 10 mg/dL (ref 6–23)
CALCIUM: 9.8 mg/dL (ref 8.4–10.5)
CO2: 22 mEq/L (ref 19–32)
CREATININE: 0.66 mg/dL (ref 0.50–1.10)
Chloride: 102 mEq/L (ref 96–112)
GFR calc non Af Amer: >90 mL/min (ref >90)
GLUCOSE: 104 mg/dL — AB (ref 70–99)
POTASSIUM: 3 meq/L — AB (ref 3.7–5.3)
SODIUM: 143 meq/L (ref 137–147)
Total Bilirubin: 0.3 mg/dL (ref 0.3–1.2)
Total Protein: 7.4 g/dL (ref 6.0–8.3)

## 2013-04-03 LAB — URINALYSIS, ROUTINE W REFLEX MICROSCOPIC
Bilirubin Urine: NEGATIVE
GLUCOSE, UA: 100 mg/dL — AB
Ketones, ur: NEGATIVE mg/dL
LEUKOCYTES UA: NEGATIVE
Nitrite: NEGATIVE
PH: 6 (ref 5.0–8.0)
Protein, ur: NEGATIVE mg/dL
SPECIFIC GRAVITY, URINE: 1.017 (ref 1.005–1.030)
Urobilinogen, UA: 0.2 mg/dL (ref 0.0–1.0)

## 2013-04-03 LAB — URINE MICROSCOPIC-ADD ON

## 2013-04-03 LAB — POCT PREGNANCY, URINE: Preg Test, Ur: NEGATIVE

## 2013-04-03 MED ORDER — HYDROMORPHONE HCL PF 1 MG/ML IJ SOLN
1.0000 mg | Freq: Once | INTRAMUSCULAR | Status: AC
  Administered 2013-04-03: 1 mg via INTRAVENOUS
  Filled 2013-04-03: qty 1

## 2013-04-03 MED ORDER — ONDANSETRON HCL 4 MG/2ML IJ SOLN
4.0000 mg | Freq: Once | INTRAMUSCULAR | Status: AC
  Administered 2013-04-03: 4 mg via INTRAVENOUS
  Filled 2013-04-03: qty 2

## 2013-04-03 MED ORDER — KETOROLAC TROMETHAMINE 30 MG/ML IJ SOLN
30.0000 mg | Freq: Once | INTRAMUSCULAR | Status: AC
  Administered 2013-04-03: 15 mg via INTRAVENOUS
  Filled 2013-04-03: qty 1

## 2013-04-03 MED ORDER — ONDANSETRON HCL 4 MG PO TABS: 4.0000 mg | ORAL_TABLET | Freq: Four times a day (QID) | ORAL | Status: DC

## 2013-04-03 MED ORDER — OXYCODONE-ACETAMINOPHEN 5-325 MG PO TABS: 1.0000 | ORAL_TABLET | Freq: Four times a day (QID) | ORAL | Status: DC | PRN

## 2013-04-03 NOTE — ED Provider Notes (Signed)
CSN: [id number]     Arrival date & time 04/03/13  0035 History   First MD Initiated Contact with Patient 04/03/13 0044     Chief Complaint  Patient presents with  . Flank Pain   (Consider location/radiation/quality/duration/timing/severity/associated sxs/prior Treatment) HPI Comments: Patient presents to the emergency department with chief complaint of left-sided flank pain. She states that she has been treating a chronic UTI with Cipro. She states that she was seen at her primary care office today, and was told that she might have a kidney stone. She then came to the emergency department. She states that her pain is 10 out of 10. Nothing makes his symptoms better or worse. She endorses associated nausea, but this has resolved with Zofran given in triage. She was treated with Toradol by her PCP, with some temporary relief. She denies fever or chills. No other complaints at this time.  The history is provided by the patient. No language interpreter was used.    Past Medical History  Diagnosis Date  . Depression   . Thyroid disease    History reviewed. No pertinent past surgical history. History reviewed. No pertinent family history. History  Substance Use Topics  . Smoking status: Never Smoker   . Smokeless tobacco: Not on file  . Alcohol Use: Yes   OB History   Grav Para Term Preterm Abortions TAB SAB Ect Mult Living                 Review of Systems  All other systems reviewed and are negative.    Allergies  Latex and Penicillins  Home Medications   Current Outpatient Rx  Name  Route  Sig  Dispense  Refill  . buPROPion (WELLBUTRIN XL) 150 MG 24 hr tablet   Oral   Take 150 mg by mouth 3 (three) times daily.         . ciprofloxacin (CIPRO) 500 MG tablet   Oral   Take 500 mg by mouth 2 (two) times daily.         Marland Kitchen. escitalopram (LEXAPRO) 20 MG tablet   Oral   Take 20 mg by mouth daily.         . Estradiol 1 MG/GM GEL   Transdermal   Place 1 Package onto the  skin daily.         Marland Kitchen. levothyroxine (SYNTHROID, LEVOTHROID) 112 MCG tablet   Oral   Take 112 mcg by mouth daily before breakfast.         . loratadine (CLARITIN) 10 MG tablet   Oral   Take 10 mg by mouth daily.         Marland Kitchen. oxyCODONE-acetaminophen (PERCOCET/ROXICET) 5-325 MG per tablet   Oral   Take 1 tablet by mouth every 4 (four) hours as needed for severe pain (pain).          BP 167/118  Pulse 135  Temp(Src) 98.3 F (36.8 C)  SpO2 100%  LMP 03/27/2013 Physical Exam  Nursing note and vitals reviewed. Constitutional: She is oriented to person, place, and time. She appears well-developed and well-nourished.  Patient is pacing the room, and appears uncomfortable  HENT:  Head: Normocephalic and atraumatic.  Eyes: Conjunctivae and EOM are normal. Pupils are equal, round, and reactive to light.  Neck: Normal range of motion. Neck supple.  Cardiovascular: Normal rate and regular rhythm.  Exam reveals no gallop and no friction rub.   No murmur heard. Pulmonary/Chest: Effort normal and breath sounds normal. No respiratory  distress. She has no wheezes. She has no rales. She exhibits no tenderness.  Abdominal: Soft. Bowel sounds are normal. She exhibits no distension and no mass. There is no tenderness. There is no rebound and no guarding.  Left sided CVA tenderness, no focal abdominal tenderness  Musculoskeletal: Normal range of motion. She exhibits no edema and no tenderness.  Neurological: She is alert and oriented to person, place, and time.  Skin: Skin is warm and dry.  Psychiatric: She has a normal mood and affect. Her behavior is normal. Judgment and thought content normal.    ED Course  Procedures (including critical care time) Results for orders placed during the hospital encounter of 04/03/13  CBC WITH DIFFERENTIAL      Result Value Range   WBC 11.9 (*) 4.0 - 10.5 K/uL   RBC 4.62  3.87 - 5.11 MIL/uL   Hemoglobin 13.6  12.0 - 15.0 g/dL   HCT 16.1  09.6 - 04.5 %    MCV 84.0  78.0 - 100.0 fL   MCH 29.4  26.0 - 34.0 pg   MCHC 35.1  30.0 - 36.0 g/dL   RDW 40.9  81.1 - 91.4 %   Platelets 373  150 - 400 K/uL   Neutrophils Relative % 71  43 - 77 %   Neutro Abs 8.4 (*) 1.7 - 7.7 K/uL   Lymphocytes Relative 21  12 - 46 %   Lymphs Abs 2.5  0.7 - 4.0 K/uL   Monocytes Relative 8  3 - 12 %   Monocytes Absolute 0.9  0.1 - 1.0 K/uL   Eosinophils Relative 0  0 - 5 %   Eosinophils Absolute 0.0  0.0 - 0.7 K/uL   Basophils Relative 0  0 - 1 %   Basophils Absolute 0.0  0.0 - 0.1 K/uL  COMPREHENSIVE METABOLIC PANEL      Result Value Range   Sodium 143  137 - 147 mEq/L   Potassium 3.0 (*) 3.7 - 5.3 mEq/L   Chloride 102  96 - 112 mEq/L   CO2 22  19 - 32 mEq/L   Glucose, Bld 104 (*) 70 - 99 mg/dL   BUN 10  6 - 23 mg/dL   Creatinine, Ser 7.82  0.50 - 1.10 mg/dL   Calcium 9.8  8.4 - 95.6 mg/dL   Total Protein 7.4  6.0 - 8.3 g/dL   Albumin 3.8  3.5 - 5.2 g/dL   AST 15  0 - 37 U/L   ALT 20  0 - 35 U/L   Alkaline Phosphatase 72  39 - 117 U/L   Total Bilirubin 0.3  0.3 - 1.2 mg/dL   GFR calc non Af Amer >90  >90 mL/min   GFR calc Af Amer >90  >90 mL/min  URINALYSIS, ROUTINE W REFLEX MICROSCOPIC      Result Value Range   Color, Urine YELLOW  YELLOW   APPearance CLOUDY (*) CLEAR   Specific Gravity, Urine 1.017  1.005 - 1.030   pH 6.0  5.0 - 8.0   Glucose, UA 100 (*) NEGATIVE mg/dL   Hgb urine dipstick LARGE (*) NEGATIVE   Bilirubin Urine NEGATIVE  NEGATIVE   Ketones, ur NEGATIVE  NEGATIVE mg/dL   Protein, ur NEGATIVE  NEGATIVE mg/dL   Urobilinogen, UA 0.2  0.0 - 1.0 mg/dL   Nitrite NEGATIVE  NEGATIVE   Leukocytes, UA NEGATIVE  NEGATIVE  URINE MICROSCOPIC-ADD ON      Result Value Range   Squamous Epithelial /  LPF RARE  RARE   WBC, UA 7-10  <3 WBC/hpf   RBC / HPF 3-6  <3 RBC/hpf   Bacteria, UA MANY (*) RARE  POCT PREGNANCY, URINE      Result Value Range   Preg Test, Ur NEGATIVE  NEGATIVE   Ct Abdomen Pelvis Wo Contrast  04/03/2013   CLINICAL DATA:   Left flank pain.  EXAM: CT ABDOMEN AND PELVIS WITHOUT CONTRAST  TECHNIQUE: Multidetector CT imaging of the abdomen and pelvis was performed following the standard protocol without intravenous contrast.  COMPARISON:  CT of the abdomen pelvis December 29, 2011.  FINDINGS: Included view of the lung bases are clear. The visualized heart and pericardium are unremarkable.  Kidneys are orthotopic, demonstrating normal size and morphology without nephrolithiasis, hydronephrosis; limited assessment for renal masses on this nonenhanced examination. The unopacified ureters are normal in course and caliber. Urinary bladder is partially distended and unremarkable.  The liver, spleen, gallbladder, pancreas and adrenal glands are unremarkable for this non-contrast examination.  The stomach, small and large bowel are normal in course and caliber without inflammatory changes, the sensitivity may be decreased by lack of enteric contrast. Normal appendix. No intraperitoneal free fluid nor free air. The mesenteric stranding seen on prior examination is resolved.  Great vessels are normal in course and caliber. No lymphadenopathy by CT size criteria. Internal reproductive organs are non-suspicious, IUD in place. The soft tissues and included osseous structures are nonsuspicious. Scattered sclerotic foci in the osseous pelvis likely reflect bone islands. Lying in  IMPRESSION: No acute intra-abdominal or pelvic process.  Interval placement of intrauterine device.   Electronically Signed   By: Awilda Metro   On: 04/03/2013 03:34     EKG Interpretation   None       MDM   1. UTI (lower urinary tract infection)   2. Flank pain     Patient with flank pain, recurrent UTIs, and possible kidney stone. Will treat pain, check labs, and CT for kidney stone. No history of kidney stones.  CT is negative.   Pain is controlled the emergency department. Patient is much better. Will discharge to home with pain medicine. Continue  Cipro prescribed by PCP. Urine culture is pending. Patient is stable and ready for discharge.  Discussed with Dr. Rhunette Croft, who agrees with the plan.   Roxy Horseman, PA-C 04/03/13 [phone]

## 2013-04-03 NOTE — Discharge Instructions (Signed)
Abdominal Pain, Adult °Many things can cause abdominal pain. Usually, abdominal pain is not caused by a disease and will improve without treatment. It can often be observed and treated at home. Your health care provider will do a physical exam and possibly order blood tests and X-rays to help determine the seriousness of your pain. However, in many cases, more time must pass before a clear cause of the pain can be found. Before that point, your health care provider may not know if you need more testing or further treatment. °HOME CARE INSTRUCTIONS  °Monitor your abdominal pain for any changes. The following actions may help to alleviate any discomfort you are experiencing: °· Only take over-the-counter or prescription medicines as directed by your health care provider. °· Do not take laxatives unless directed to do so by your health care provider. °· Try a clear liquid diet (broth, tea, or water) as directed by your health care provider. Slowly move to a bland diet as tolerated. °SEEK MEDICAL CARE IF: °· You have unexplained abdominal pain. °· You have abdominal pain associated with nausea or diarrhea. °· You have pain when you urinate or have a bowel movement. °· You experience abdominal pain that wakes you in the night. °· You have abdominal pain that is worsened or improved by eating food. °· You have abdominal pain that is worsened with eating fatty foods. °SEEK IMMEDIATE MEDICAL CARE IF:  °· Your pain does not go away within 2 hours. °· You have a fever. °· You keep throwing up (vomiting). °· Your pain is felt only in portions of the abdomen, such as the right side or the left lower portion of the abdomen. °· You pass bloody or black tarry stools. °MAKE SURE YOU: °· Understand these instructions.   °· Will watch your condition.   °· Will get help right away if you are not doing well or get worse.   °Document Released: 12/09/2004 Document Revised: 12/20/2012 Document Reviewed: 11/08/2012 °[employer]® Patient  Information ©2014 [employer]. ° °

## 2013-04-03 NOTE — ED Provider Notes (Signed)
Medical screening examination/treatment/procedure(s) were performed by non-physician practitioner and as supervising physician I was immediately available for consultation/collaboration.  EKG Interpretation   None        Sharon KaplanAnkit Davinci Glotfelty, MD 04/03/13 [phone]

## 2013-04-03 NOTE — ED Notes (Signed)
Pt complains of left flank pain, was seen at primary and told she had a UTI or kidney stone, pt had relief with toradol at Dr.

## 2013-04-04 LAB — URINE CULTURE
COLONY COUNT: NO GROWTH
CULTURE: NO GROWTH

## 2021-07-23 ENCOUNTER — Ambulatory Visit: Admit: 2021-07-23 | Discharge status: Absent without leave | Payer: Self-pay

## 2021-07-24 ENCOUNTER — Ambulatory Visit
Admission: EM | Admit: 2021-07-24 | Discharge: 2021-07-24 | Disposition: A | Discharge status: Home / Self Care | Payer: 59

## 2021-07-24 ENCOUNTER — Ambulatory Visit (INDEPENDENT_AMBULATORY_CARE_PROVIDER_SITE_OTHER): Payer: 59

## 2021-07-24 VITALS — BP 111/74 | HR 83 | Temp 98.7°F | Resp 18

## 2021-07-24 DIAGNOSIS — J069 Acute upper respiratory infection, unspecified: Secondary | ICD-10-CM | POA: Diagnosis not present

## 2021-07-24 DIAGNOSIS — R062 Wheezing: Secondary | ICD-10-CM

## 2021-07-24 DIAGNOSIS — J4521 Mild intermittent asthma with (acute) exacerbation: Secondary | ICD-10-CM | POA: Diagnosis not present

## 2021-07-24 DIAGNOSIS — R0600 Dyspnea, unspecified: Secondary | ICD-10-CM | POA: Diagnosis not present

## 2021-07-24 DIAGNOSIS — R051 Acute cough: Secondary | ICD-10-CM | POA: Diagnosis not present

## 2021-07-24 MED ORDER — CHERATUSSIN AC 100-10 MG/5ML PO SOLN: 5.0000 mL | Freq: Four times a day (QID) | ORAL | 0 refills | Status: DC | PRN

## 2021-07-24 MED ORDER — PREDNISONE 20 MG PO TABS: 40.0000 mg | ORAL_TABLET | Freq: Every day | ORAL | 0 refills | Status: AC

## 2021-07-24 MED ORDER — ALBUTEROL SULFATE HFA 108 (90 BASE) MCG/ACT IN AERS: 2.0000 | INHALATION_SPRAY | RESPIRATORY_TRACT | 0 refills | Status: DC | PRN

## 2021-07-24 MED ORDER — ALBUTEROL SULFATE (2.5 MG/3ML) 0.083% IN NEBU
2.5000 mg | INHALATION_SOLUTION | Freq: Once | RESPIRATORY_TRACT | Status: AC
  Administered 2021-07-24: 2.5 mg via RESPIRATORY_TRACT

## 2021-07-24 NOTE — ED Triage Notes (Signed)
Pt c/o chest tightness, earache, sore throat, sinus pain, cough ? ?Onset ~ Tuesday  ?

## 2021-07-24 NOTE — ED Provider Notes (Addendum)
?[facility] ? ? ? ?CSN: [id number] ?Arrival date & time: 07/24/21  1501 ? ? ?  ? ?History   ?Chief Complaint ?Chief Complaint  ?Patient presents with  ? Cough  ?  Entered by patient  ? ? ?HPI ?Sharon Richard is a 51 y.o. female.  ? ? ?Cough ?Here for cough and congestion that began May 7.  Then she got worse on May ninth with more shortness of breath and some subjective fever. She did have n/v at first, but now no longer nauseated.  ? ?She has used an inhaler a long time ago for asthma ? ?Past Medical History:  ?Diagnosis Date  ? Depression   ? Thyroid disease   ? ? ?Patient Active Problem List  ? Diagnosis Date Noted  ? HYPOTHYROIDISM 11/25/2006  ? DEPRESSION 11/25/2006  ? ALLERGIC RHINITIS 09/27/2006  ? ASTHMA 09/27/2006  ? ? ?History reviewed. No pertinent surgical history. ? ?OB History   ?No obstetric history on file. ?  ? ? ? ?Home Medications   ? ?Prior to Admission medications   ?Medication Sig Start Date End Date Taking? Authorizing Provider  ?albuterol (VENTOLIN HFA) 108 (90 Base) MCG/ACT inhaler Inhale 2 puffs into the lungs every 4 (four) hours as needed for wheezing or shortness of breath. 07/24/21  Yes Zenia Resides, MD  ?guaiFENesin-codeine [facility]) 100-10 MG/5ML syrup Take 5 mLs by mouth 4 (four) times daily as needed for cough. 07/24/21  Yes Zenia Resides, MD  ?predniSONE (DELTASONE) 20 MG tablet Take 2 tablets (40 mg total) by mouth daily with breakfast for 5 days. 07/24/21 07/29/21 Yes Mary Secord, Janace Aris, MD  ?escitalopram (LEXAPRO) 20 MG tablet Take 20 mg by mouth daily.    [provider]  ?levothyroxine (SYNTHROID, LEVOTHROID) 112 MCG tablet Take 112 mcg by mouth daily before breakfast.    [provider]  ?liothyronine (CYTOMEL) 25 MCG tablet Take 25 mcg by mouth daily. 06/18/21   [provider]  ?loratadine (CLARITIN) 10 MG tablet Take 10 mg by mouth daily.    [provider]  ? ? ?Family History ?History reviewed. No pertinent  family history. ? ?Social History ?Social History  ? ?Tobacco Use  ? Smoking status: Never  ?Substance Use Topics  ? Alcohol use: Yes  ? ? ? ?Allergies   ?Latex and Penicillins ? ? ?Review of Systems ?Review of Systems  ?Respiratory:  Positive for cough.   ? ? ?Physical Exam ?Triage Vital Signs ?ED Triage Vitals [07/24/21 1529]  ?Enc Vitals Group  ?   BP 111/74  ?   Pulse Rate 83  ?   Resp 18  ?   Temp 98.7 ?F (37.1 ?C)  ?   Temp Source Oral  ?   SpO2 97 %  ?   Weight   ?   Height   ?   Head Circumference   ?   Peak Flow   ?   Pain Score 0  ?   Pain Loc   ?   Pain Edu?   ?   Excl. in GC?   ? ?No data found. ? ?Updated Vital Signs ?BP 111/74 (BP Location: Right Arm)   Pulse 83   Temp 98.7 ?F (37.1 ?C) (Oral)   Resp 18   SpO2 97%  ? ?Visual Acuity ?Right Eye Distance:   ?Left Eye Distance:   ?Bilateral Distance:   ? ?Right Eye Near:   ?Left Eye Near:    ?Bilateral Near:    ? ?  Physical Exam ?Vitals reviewed.  ?Constitutional:   ?   General: She is not in acute distress. ?   Appearance: She is not toxic-appearing.  ?HENT:  ?   Right Ear: Tympanic membrane and ear canal normal.  ?   Left Ear: Tympanic membrane and ear canal normal.  ?   Nose: Nose normal.  ?   Mouth/Throat:  ?   Mouth: Mucous membranes are moist.  ?   Pharynx: No oropharyngeal exudate or posterior oropharyngeal erythema.  ?Eyes:  ?   Extraocular Movements: Extraocular movements intact.  ?   Conjunctiva/sclera: Conjunctivae normal.  ?   Pupils: Pupils are equal, round, and reactive to light.  ?Cardiovascular:  ?   Rate and Rhythm: Normal rate and regular rhythm.  ?   Heart sounds: No murmur heard. ?Pulmonary:  ?   Effort: Pulmonary effort is normal. No respiratory distress.  ?   Breath sounds: No rhonchi or rales.  ?   Comments: End exp wheeze with tight exp phase ?Chest:  ?   Chest wall: No tenderness.  ?Musculoskeletal:  ?   Cervical back: Neck supple.  ?Lymphadenopathy:  ?   Cervical: No cervical adenopathy.  ?Skin: ?   Capillary Refill: Capillary  refill takes less than 2 seconds.  ?   Coloration: Skin is not jaundiced or pale.  ?Neurological:  ?   General: No focal deficit present.  ?   Mental Status: She is alert and oriented to person, place, and time.  ?Psychiatric:     ?   Behavior: Behavior normal.  ? ? ? ?UC Treatments / Results  ?Labs ?(all labs ordered are listed, but only abnormal results are displayed) ?Labs Reviewed  ?NOVEL CORONAVIRUS, NAA  ? ? ?EKG ? ? ?Radiology ?DG Chest 2 View ? ?Result Date: 07/24/2021 ?CLINICAL DATA:  Cough, dyspnea, wheezing EXAM: CHEST - 2 VIEW COMPARISON:  07/01/2006 FINDINGS: Transverse diameter of heart is in the upper limits of normal. There are no signs of pulmonary edema or focal pulmonary consolidation. There is slight prominence of interstitial markings in the parahilar regions. There is no pleural effusion or pneumothorax. There is mild elevation of left hemidiaphragm. IMPRESSION: There are no signs of alveolar pulmonary edema or focal pulmonary consolidation. There is prominence of interstitial markings in the parahilar regions, possibly suggesting bronchitis. Electronically Signed   By: Ernie Avena M.D.   On: 07/24/2021 16:59   ? ?Procedures ?Procedures (including critical care time) ? ?Medications Ordered in UC ?Medications  ?albuterol (PROVENTIL) (2.5 MG/3ML) 0.083% nebulizer solution 2.5 mg (2.5 mg Nebulization Given 07/24/21 1640)  ? ? ?Initial Impression / Assessment and Plan / UC Course  ?I have reviewed the triage vital signs and the nursing notes. ? ?Pertinent labs & imaging results that were available during my care of the patient were reviewed by me and considered in my medical decision making (see chart for details). ? ?  ? ?CXR is clear except suggestion of bronchitis. I will treat for asthma exacerbation and she requests cough syrup. ? ?Lung exam did improve after neb tx, and was clear. She also felt better ? ?OK on pmp ?Final Clinical Impressions(s) / UC Diagnoses  ? ?Final diagnoses:   ?Acute cough  ?Wheezing  ?Mild intermittent asthma with (acute) exacerbation  ?Viral URI with cough  ? ? ? ?Discharge Instructions   ? ?  ?Your chest x-ray was clear ? ?You are given an albuterol breathing treatment here in the clinic ? ?Albuterol inhaler--do 2 puffs every  4 hours as needed for shortness of breath or wheezing  ? ?Prednisone 20 mg--2 daily for 5 days ? ?Cough syrup has codeine in it--take 5 mL every 6 hours as needed for cough; it can make you sleepy ? ? ? ? ?ED Prescriptions   ? ? Medication Sig Dispense Auth. Provider  ? albuterol (VENTOLIN HFA) 108 (90 Base) MCG/ACT inhaler Inhale 2 puffs into the lungs every 4 (four) hours as needed for wheezing or shortness of breath. 1 each Zenia ResidesBanister, Bintou Lafata K, MD  ? predniSONE (DELTASONE) 20 MG tablet Take 2 tablets (40 mg total) by mouth daily with breakfast for 5 days. 10 tablet Zenia ResidesBanister, Shenita Trego K, MD  ? guaiFENesin-codeine [facility](CHERATUSSIN AC) 100-10 MG/5ML syrup Take 5 mLs by mouth 4 (four) times daily as needed for cough. 120 mL Zenia ResidesBanister, Parvin Stetzer K, MD  ? ?  ? ?I have reviewed the PDMP during this encounter. ?  ?Zenia ResidesBanister, Lacrystal Barbe K, MD ?07/24/21 1709 ? ?  ?Zenia ResidesBanister, Malcome Ambrocio K, MD ?07/24/21 1710 ? ?

## 2021-07-24 NOTE — Discharge Instructions (Addendum)
Your chest x-ray was clear ? ?You are given an albuterol breathing treatment here in the clinic ? ?Albuterol inhaler--do 2 puffs every 4 hours as needed for shortness of breath or wheezing  ? ?Prednisone 20 mg--2 daily for 5 days ? ?Cough syrup has codeine in it--take 5 mL every 6 hours as needed for cough; it can make you sleepy ?

## 2021-07-25 LAB — NOVEL CORONAVIRUS, NAA: SARS-CoV-2, NAA: NOT DETECTED

## 2021-09-01 ENCOUNTER — Ambulatory Visit
Admission: RE | Admit: 2021-09-01 | Discharge: 2021-09-01 | Disposition: A | Discharge status: Home / Self Care | Payer: 59 | Source: Ambulatory Visit | Attending: Family Medicine | Admitting: Family Medicine

## 2021-09-01 VITALS — BP 109/73 | HR 92 | Temp 97.5°F | Resp 18

## 2021-09-01 DIAGNOSIS — J019 Acute sinusitis, unspecified: Secondary | ICD-10-CM

## 2021-09-01 MED ORDER — CEFDINIR 300 MG PO CAPS: 300.0000 mg | ORAL_CAPSULE | Freq: Two times a day (BID) | ORAL | 0 refills | Status: DC

## 2021-09-01 MED ORDER — BENZONATATE 100 MG PO CAPS: 100.0000 mg | ORAL_CAPSULE | Freq: Three times a day (TID) | ORAL | 0 refills | Status: DC | PRN

## 2021-09-01 NOTE — ED Triage Notes (Signed)
Pt co of cough that has remained since last visit 5/12. Pt st she was rx a steroid and her breathing has gotten better but cough remained. Pt reports cough medication at night with minimal improvement of symptoms.

## 2021-09-01 NOTE — Discharge Instructions (Addendum)
Take cefdinir 300 mg--2 daily for 7 days  Take benzonatate 100 mg, 1 tab every 8 hours as needed for cough.

## 2021-09-01 NOTE — ED Provider Notes (Signed)
[facility]    CSN: [id number] Arrival date & time: 09/01/21  1443      History   Chief Complaint Chief Complaint  Patient presents with   Cough    Same symptoms as last visit - Entered by patient    HPI Sharon Richard is a 51 y.o. female.    Cough  Here for continued cough, sinus congestion, and postnasal drainage.  She also has some pressure in her cheeks and behind her eyes.  She was seen here May 12, and she was treated with 5 days of prednisone and albuterol for a possible asthma exacerbation.  The dyspnea she was having did improve rapidly with that treatment.  She has tried using the inhaler for this current cough and it does not help.  Also the codeine cough syrup I prescribed at last visit has not helped  No recent fever or vomiting  Past Medical History:  Diagnosis Date   Depression    Thyroid disease     Patient Active Problem List   Diagnosis Date Noted   HYPOTHYROIDISM 11/25/2006   DEPRESSION 11/25/2006   ALLERGIC RHINITIS 09/27/2006   ASTHMA 09/27/2006    No past surgical history on file.  OB History   No obstetric history on file.      Home Medications    Prior to Admission medications   Medication Sig Start Date End Date Taking? Authorizing Provider  benzonatate (TESSALON) 100 MG capsule Take 1 capsule (100 mg total) by mouth 3 (three) times daily as needed for cough. 09/01/21  Yes Zenia Resides, MD  cefdinir (OMNICEF) 300 MG capsule Take 1 capsule (300 mg total) by mouth 2 (two) times daily. 09/01/21  Yes Zenia Resides, MD  albuterol (VENTOLIN HFA) 108 (90 Base) MCG/ACT inhaler Inhale 2 puffs into the lungs every 4 (four) hours as needed for wheezing or shortness of breath. 07/24/21   Zenia Resides, MD  escitalopram (LEXAPRO) 20 MG tablet Take 20 mg by mouth daily.    [provider]  levothyroxine (SYNTHROID, LEVOTHROID) 112 MCG tablet Take 112 mcg by mouth daily before breakfast.    [provider]  liothyronine (CYTOMEL) 25 MCG tablet Take 25 mcg by mouth daily. 06/18/21   [provider]  loratadine (CLARITIN) 10 MG tablet Take 10 mg by mouth daily.    [provider]    Family History No family history on file.  Social History Social History   Tobacco Use   Smoking status: Never  Substance Use Topics   Alcohol use: Yes     Allergies   Latex and Penicillins   Review of Systems Review of Systems  Respiratory:  Positive for cough.      Physical Exam Triage Vital Signs ED Triage Vitals [09/01/21 1454]  Enc Vitals Group     BP 109/73     Pulse Rate 92     Resp 18     Temp (!) 97.5 F (36.4 C)     Temp Source Oral     SpO2 96 %     Weight      Height      Head Circumference      Peak Flow      Pain Score 0     Pain Loc      Pain Edu?      Excl. in GC?    No data found.  Updated Vital Signs BP 109/73   Pulse 92  Temp (!) 97.5 F (36.4 C) (Oral)   Resp 18   SpO2 96%   Visual Acuity Right Eye Distance:   Left Eye Distance:   Bilateral Distance:    Right Eye Near:   Left Eye Near:    Bilateral Near:     Physical Exam Vitals reviewed.  Constitutional:      General: She is not in acute distress.    Appearance: She is not toxic-appearing.  HENT:     Right Ear: Tympanic membrane and ear canal normal.     Left Ear: Tympanic membrane and ear canal normal.     Nose: Nose normal.     Mouth/Throat:     Mouth: Mucous membranes are moist.     Pharynx: No oropharyngeal exudate or posterior oropharyngeal erythema.  Eyes:     Extraocular Movements: Extraocular movements intact.     Conjunctiva/sclera: Conjunctivae normal.     Pupils: Pupils are equal, round, and reactive to light.  Cardiovascular:     Rate and Rhythm: Normal rate and regular rhythm.     Heart sounds: No murmur heard. Pulmonary:     Effort: Pulmonary effort is normal. No respiratory distress.     Breath sounds: No wheezing, rhonchi or rales.  Chest:      Chest wall: No tenderness.  Musculoskeletal:     Cervical back: Neck supple.  Lymphadenopathy:     Cervical: No cervical adenopathy.  Skin:    Capillary Refill: Capillary refill takes less than 2 seconds.     Coloration: Skin is not jaundiced or pale.  Neurological:     General: No focal deficit present.     Mental Status: She is alert and oriented to person, place, and time.  Psychiatric:        Behavior: Behavior normal.      UC Treatments / Results  Labs (all labs ordered are listed, but only abnormal results are displayed) Labs Reviewed - No data to display  EKG   Radiology No results found.  Procedures Procedures (including critical care time)  Medications Ordered in UC Medications - No data to display  Initial Impression / Assessment and Plan / UC Course  I have reviewed the triage vital signs and the nursing notes.  Pertinent labs & imaging results that were available during my care of the patient were reviewed by me and considered in my medical decision making (see chart for details).     With the length of her symptoms, sinus pressure, and postnasal drainage, I am going to go ahead and treat for acute sinusitis. Final Clinical Impressions(s) / UC Diagnoses   Final diagnoses:  Acute sinusitis, recurrence not specified, unspecified location     Discharge Instructions      Take cefdinir 300 mg--2 daily for 7 days  Take benzonatate 100 mg, 1 tab every 8 hours as needed for cough.       ED Prescriptions     Medication Sig Dispense Auth. Provider   cefdinir (OMNICEF) 300 MG capsule Take 1 capsule (300 mg total) by mouth 2 (two) times daily. 14 capsule Syriana Croslin, Janace Aris, MD   benzonatate (TESSALON) 100 MG capsule Take 1 capsule (100 mg total) by mouth 3 (three) times daily as needed for cough. 21 capsule Zenia Resides, MD      PDMP not reviewed this encounter.   Zenia Resides, MD 09/01/21 [id number]

## 2023-02-13 DEATH — deceased
